# Patient Record
Sex: Female | Born: 1995 | Race: White | Hispanic: Yes | Marital: Married | Smoking: Never smoker
Health system: Southern US, Community
[De-identification: ages and names within clinical notes are randomized; demographics above are authoritative.]

---

## 2017-01-22 DIAGNOSIS — N939 Abnormal uterine and vaginal bleeding, unspecified: Secondary | ICD-10-CM | POA: Insufficient documentation

## 2017-01-22 DIAGNOSIS — R102 Pelvic and perineal pain: Secondary | ICD-10-CM | POA: Insufficient documentation

## 2017-01-22 DIAGNOSIS — K0889 Other specified disorders of teeth and supporting structures: Secondary | ICD-10-CM | POA: Insufficient documentation

## 2017-01-23 ENCOUNTER — Emergency Department (HOSPITAL_COMMUNITY)
Admission: EM | Admit: 2017-01-23 | Discharge: 2017-01-23 | Disposition: A | Discharge status: Home / Self Care | Payer: Self-pay | Attending: Emergency Medicine | Admitting: Emergency Medicine

## 2017-01-23 ENCOUNTER — Emergency Department (HOSPITAL_COMMUNITY): Payer: Self-pay

## 2017-01-23 ENCOUNTER — Encounter (HOSPITAL_COMMUNITY): Payer: Self-pay

## 2017-01-23 DIAGNOSIS — R102 Pelvic and perineal pain: Secondary | ICD-10-CM

## 2017-01-23 LAB — LIPASE, BLOOD: LIPASE: 26 U/L (ref 11–51)

## 2017-01-23 LAB — COMPREHENSIVE METABOLIC PANEL
ALT: 13 U/L — ABNORMAL LOW (ref 14–54)
ANION GAP: 7 (ref 5–15)
AST: 20 U/L (ref 15–41)
Albumin: 4.4 g/dL (ref 3.5–5.0)
Alkaline Phosphatase: 76 U/L (ref 38–126)
BILIRUBIN TOTAL: 0.6 mg/dL (ref 0.3–1.2)
BUN: 5 mg/dL — ABNORMAL LOW (ref 6–20)
CHLORIDE: 107 mmol/L (ref 101–111)
CO2: 25 mmol/L (ref 22–32)
Calcium: 9.1 mg/dL (ref 8.9–10.3)
Creatinine, Ser: 0.6 mg/dL (ref 0.44–1.00)
Glucose, Bld: 87 mg/dL (ref 65–99)
POTASSIUM: 3.8 mmol/L (ref 3.5–5.1)
Sodium: 139 mmol/L (ref 135–145)
TOTAL PROTEIN: 7.8 g/dL (ref 6.5–8.1)

## 2017-01-23 LAB — URINALYSIS, ROUTINE W REFLEX MICROSCOPIC
BACTERIA UA: NONE SEEN
BILIRUBIN URINE: NEGATIVE
GLUCOSE, UA: NEGATIVE mg/dL
Hgb urine dipstick: NEGATIVE
KETONES UR: NEGATIVE mg/dL
NITRITE: NEGATIVE
PH: 6 (ref 5.0–8.0)
PROTEIN: NEGATIVE mg/dL
Specific Gravity, Urine: 1.012 (ref 1.005–1.030)

## 2017-01-23 LAB — CBC
HEMATOCRIT: 40.6 % (ref 36.0–46.0)
Hemoglobin: 13.1 g/dL (ref 12.0–15.0)
MCH: 30.2 pg (ref 26.0–34.0)
MCHC: 32.3 g/dL (ref 30.0–36.0)
MCV: 93.5 fL (ref 78.0–100.0)
Platelets: 271 10*3/uL (ref 150–400)
RBC: 4.34 MIL/uL (ref 3.87–5.11)
RDW: 13.4 % (ref 11.5–15.5)
WBC: 7.5 10*3/uL (ref 4.0–10.5)

## 2017-01-23 LAB — WET PREP, GENITAL
Clue Cells Wet Prep HPF POC: NONE SEEN
SPERM: NONE SEEN
Trich, Wet Prep: NONE SEEN
YEAST WET PREP: NONE SEEN

## 2017-01-23 LAB — I-STAT BETA HCG BLOOD, ED (MC, WL, AP ONLY): I-stat hCG, quantitative: <5 m[IU]/mL (ref <5)

## 2017-01-23 MED ORDER — IBUPROFEN 400 MG PO TABS
400.0000 mg | ORAL_TABLET | Freq: Once | ORAL | Status: AC
  Administered 2017-01-23: 400 mg via ORAL
  Filled 2017-01-23: qty 1

## 2017-01-23 NOTE — ED Triage Notes (Signed)
Pt reports middle abdominal pain. Ongoing 8 years but the flare up started a week ago. She denies n/v/d but states she has Mirena placed. She is also reporting dental pain and infection to left lower molar tooth.

## 2017-01-23 NOTE — Discharge Instructions (Signed)
Your lab work, urinalysis, pelvic ultrasounds were all normal today. There is no evidence of ovarian torsion or cysts. You do not have a urinary tract infection. Your IUD is in the correct place. You do not have trichomonas, bacterial vaginosis or yeast infection.  Your gonorrhea and chlamydia testing are still pending, you will be contact within 2-3 days if results are positive. You declined treatment today, so if your results are positive you will need treatment.   It is still unclear as to what is causing your abdominal/pelvic pain.  Please establish care with an OB/GYN provider and make an appointment for further discussion of your symptoms and complete workup. Return to the emergency department for worsening pelvic pain associated with uncontrollable nausea, vomiting, vaginal bleeding or discharge.  You may take ibuprofen or Tylenol for pain.

## 2017-01-23 NOTE — ED Notes (Signed)
Patient transported to Ultrasound 

## 2017-01-23 NOTE — ED Provider Notes (Signed)
[facility]-EMERGENCY DEPT Provider Note   CSN: [id number] Arrival date & time: 01/22/17  2312     History   Chief Complaint Chief Complaint  Patient presents with  . Abdominal Pain  . Dental Pain    HPI Karen Dawson is a 21 y.o. female presents to the ED with chronic periumbilical abdominal discomfort that radiates to suprapubic abdomen 8 years. Pain has worsened over the last 1 week. Patient states that she has been evaluated for this pain in the past, she was told that she might have endometriosis however she was unable to complete full workup because she became pregnant. No associated nausea, vomiting, urinary symptoms, vaginal discharge. Patient has IUD in place, she has monthly, intermittent vaginal spotting with her IUD which she is currently having. She is sexually active with men only without barrier method of contraception.    HPI   History reviewed. No pertinent past medical history.  There are no active problems to display for this patient.   History reviewed. No pertinent surgical history.  OB History    No data available       Home Medications    Prior to Admission medications   Not on File    Family History No family history on file.  Social History Social History  Substance Use Topics  . Smoking status: Never Smoker  . Smokeless tobacco: Never Used  . Alcohol use No     Allergies   Patient has no known allergies.   Review of Systems Review of Systems  Constitutional: Negative for fever.  Respiratory: Negative for cough and shortness of breath.   Cardiovascular: Negative for chest pain.  Gastrointestinal: Positive for abdominal pain. Negative for constipation, diarrhea, nausea and vomiting.  Genitourinary: Positive for pelvic pain and vaginal bleeding. Negative for difficulty urinating, dysuria, frequency, hematuria and vaginal discharge.  Musculoskeletal: Negative for back pain.  Skin: Negative for wound.     Physical Exam Updated  Vital Signs BP (!) 86/59   Pulse 71   Temp 98.4 F (36.9 C) (Oral)   Resp 16   Wt 54.4 kg (120 lb)   LMP 12/21/2016   SpO2 100%   Physical Exam  Constitutional: She is oriented to person, place, and time. She appears well-developed and well-nourished. No distress.  NAD  HENT:  Head: Normocephalic and atraumatic.  Nose: Nose normal.  Mouth/Throat: Oropharynx is clear and moist. No oropharyngeal exudate.  Eyes: Conjunctivae and EOM are normal. Pupils are equal, round, and reactive to light.  Neck: Normal range of motion. Neck supple.  Cardiovascular: Normal rate, regular rhythm, normal heart sounds and intact distal pulses.   No murmur heard. Pulmonary/Chest: Effort normal and breath sounds normal. She exhibits no tenderness.  Abdominal: Soft. Bowel sounds are normal. She exhibits no distension. There is tenderness.  Suprapubic tenderness No CVA tenderness Negative Mcburney's Negative Murphy's  Genitourinary: Pelvic exam was performed with patient prone. Cervix exhibits motion tenderness. Right adnexum displays tenderness. There is bleeding in the vagina.  Genitourinary Comments: Scant amount of bleeding on cervical os Visualized IUD string through cervical os R adnexal tenderness Positive CMT External genitalia normal without erythema, edema, tenderness, discharge or lesions.  No groin lymphadenopathy.  Vaginal mucosa pink without discharge or lesions.   Musculoskeletal: Normal range of motion. She exhibits no deformity.  Lymphadenopathy:    She has no cervical adenopathy.  Neurological: She is alert and oriented to person, place, and time. No sensory deficit.  Skin: Skin is warm and dry. Capillary  refill takes less than 2 seconds.  Psychiatric: She has a normal mood and affect. Her behavior is normal. Judgment and thought content normal.  Nursing note and vitals reviewed.    ED Treatments / Results  Labs (all labs ordered are listed, but only abnormal results are  displayed) Labs Reviewed  WET PREP, GENITAL - Abnormal; Notable for the following:       Result Value   WBC, Wet Prep HPF POC MANY (*)    All other components within normal limits  COMPREHENSIVE METABOLIC PANEL - Abnormal; Notable for the following:    BUN 5 (*)    ALT 13 (*)    All other components within normal limits  URINALYSIS, ROUTINE W REFLEX MICROSCOPIC - Abnormal; Notable for the following:    Leukocytes, UA TRACE (*)    Squamous Epithelial / LPF 0-5 (*)    All other components within normal limits  LIPASE, BLOOD  CBC  I-STAT BETA HCG BLOOD, ED ([facility], [facility], [facility] ONLY)  GC/CHLAMYDIA PROBE AMP ([place]) NOT AT [facility]    EKG  EKG Interpretation None       Radiology US Transvaginal Non-ob  Result Date: 01/23/2017 CLINICAL DATA:  Acute onset of pelvic pain.  Initial encounter. EXAM: TRANSABDOMINAL AND TRANSVAGINAL ULTRASOUND OF PELVIS DOPPLER ULTRASOUND OF OVARIES TECHNIQUE: Both transabdominal and transvaginal ultrasound examinations of the pelvis were performed. Transabdominal technique was performed for global imaging of the pelvis including uterus, ovaries, adnexal regions, and pelvic cul-de-sac. It was necessary to proceed with endovaginal exam following the transabdominal exam to visualize the uterus and ovaries in greater detail. Color and duplex Doppler ultrasound was utilized to evaluate blood flow to the ovaries. COMPARISON:  None. FINDINGS: Uterus Measurements: 7.3 x 3.1 x 6.0 cm. No fibroids or other mass visualized. Anteverted. Endometrium Thickness: 0.4 cm. An intrauterine device is noted in expected position at the fundus of the uterus. Right ovary Measurements: 2.9 x 2.0 x 1.8 cm. Normal appearance/no adnexal mass. Left ovary Measurements: 3.6 x 1.6 x 1.9 cm. Normal appearance/no adnexal mass. Pulsed Doppler evaluation of both ovaries demonstrates normal low-resistance arterial and venous waveforms. Other findings No abnormal free fluid. IMPRESSION: Unremarkable pelvic  ultrasound. Intrauterine device noted in expected position at the fundus of the uterus. No evidence for ovarian torsion. Electronically Signed   By: Roanna Raider M.D.   On: 01/23/2017 05:56   US Pelvis Complete  Result Date: 01/23/2017 CLINICAL DATA:  Acute onset of pelvic pain.  Initial encounter. EXAM: TRANSABDOMINAL AND TRANSVAGINAL ULTRASOUND OF PELVIS DOPPLER ULTRASOUND OF OVARIES TECHNIQUE: Both transabdominal and transvaginal ultrasound examinations of the pelvis were performed. Transabdominal technique was performed for global imaging of the pelvis including uterus, ovaries, adnexal regions, and pelvic cul-de-sac. It was necessary to proceed with endovaginal exam following the transabdominal exam to visualize the uterus and ovaries in greater detail. Color and duplex Doppler ultrasound was utilized to evaluate blood flow to the ovaries. COMPARISON:  None. FINDINGS: Uterus Measurements: 7.3 x 3.1 x 6.0 cm. No fibroids or other mass visualized. Anteverted. Endometrium Thickness: 0.4 cm. An intrauterine device is noted in expected position at the fundus of the uterus. Right ovary Measurements: 2.9 x 2.0 x 1.8 cm. Normal appearance/no adnexal mass. Left ovary Measurements: 3.6 x 1.6 x 1.9 cm. Normal appearance/no adnexal mass. Pulsed Doppler evaluation of both ovaries demonstrates normal low-resistance arterial and venous waveforms. Other findings No abnormal free fluid. IMPRESSION: Unremarkable pelvic ultrasound. Intrauterine device noted in expected position at the fundus of  the uterus. No evidence for ovarian torsion. Electronically Signed   By: Roanna RaiderJeffery  Chang M.D.   On: 01/23/2017 05:56   [place] Art/ven Flow Abd Pelv Doppler  Result Date: 01/23/2017 CLINICAL DATA:  Acute onset of pelvic pain.  Initial encounter. EXAM: TRANSABDOMINAL AND TRANSVAGINAL ULTRASOUND OF PELVIS DOPPLER ULTRASOUND OF OVARIES TECHNIQUE: Both transabdominal and transvaginal ultrasound examinations of the pelvis were performed.  Transabdominal technique was performed for global imaging of the pelvis including uterus, ovaries, adnexal regions, and pelvic cul-de-sac. It was necessary to proceed with endovaginal exam following the transabdominal exam to visualize the uterus and ovaries in greater detail. Color and duplex Doppler ultrasound was utilized to evaluate blood flow to the ovaries. COMPARISON:  None. FINDINGS: Uterus Measurements: 7.3 x 3.1 x 6.0 cm. No fibroids or other mass visualized. Anteverted. Endometrium Thickness: 0.4 cm. An intrauterine device is noted in expected position at the fundus of the uterus. Right ovary Measurements: 2.9 x 2.0 x 1.8 cm. Normal appearance/no adnexal mass. Left ovary Measurements: 3.6 x 1.6 x 1.9 cm. Normal appearance/no adnexal mass. Pulsed Doppler evaluation of both ovaries demonstrates normal low-resistance arterial and venous waveforms. Other findings No abnormal free fluid. IMPRESSION: Unremarkable pelvic ultrasound. Intrauterine device noted in expected position at the fundus of the uterus. No evidence for ovarian torsion. Electronically Signed   By: Roanna RaiderJeffery  Chang M.D.   On: 01/23/2017 05:56    Procedures Procedures (including critical care time)  Medications Ordered in ED Medications - No data to display   Initial Impression / Assessment and Plan / ED Course  I have reviewed the triage vital signs and the nursing notes.  Pertinent labs & imaging results that were available during my care of the patient were reviewed by me and considered in my medical decision making (see chart for details).     21 year old female presents to the ED with chronic periumbilical abdominal discomfort that radiates to the suprapubic abdomen. Pain has been ongoing for many years, recently worsening over the past week. She has been evaluated for this past and told she may have endometriosis. On exam abdomen is soft, negative McBurney's, negative Murphy's. She has mild suprapubic tenderness, CMT and  right adnexal tenderness on pelvic exam with small amounts of blood in the cervical os. Considering torsion versus ovarian cyst versus ectopic pregnancy. Physical exam and history are not consistent with GI emergent process including appendicitis or diverticulitis.   CBC, CMP, lipase unremarkable today. Urinalysis without evidence of infection. HCG negative. Wet prep without yeast, trichomoniasis or clue cells. Pending GC/chlamydia testing. Pelvic ultrasound completely benign. IUD is in place.   Final Clinical Impressions(s) / ED Diagnoses   Final diagnoses:  Pelvic pain   21 year old female with chronic lower abdominal discomfort for many years, worsening over the last 1 week. ED lab work and imaging reassuring. Doubt emergent GI/GU/GYN process today. Discussed ED lab work and ultrasound results with patient. She declined empiric treatment of STDs in the ED. Patient is considered safe for discharge with outpatient GYN follow-up. She verbalized understanding and is agreeable with plan.  New Prescriptions New Prescriptions   No medications on file     Jerrell MylarGibbons, Glorene Leitzke J, PA-C 01/23/17 [id number]    Geoffery Lyonselo, [place], MD 01/23/17 [phone]

## 2017-01-23 NOTE — ED Notes (Signed)
Pt states she understands instructions. Home stable with family with steady gait.  

## 2017-01-24 LAB — GC/CHLAMYDIA PROBE AMP (~~LOC~~) NOT AT ARMC
CHLAMYDIA, DNA PROBE: NEGATIVE
Neisseria Gonorrhea: NEGATIVE

## 2017-03-04 ENCOUNTER — Encounter: Payer: Self-pay | Admitting: Obstetrics & Gynecology

## 2017-05-29 ENCOUNTER — Encounter: Payer: Self-pay | Admitting: Obstetrics and Gynecology

## 2017-05-30 ENCOUNTER — Encounter: Payer: Self-pay | Admitting: Obstetrics and Gynecology

## 2017-05-30 NOTE — Progress Notes (Signed)
Patient did not keep GYN referral appointment for 05/29/2017.  Cornelia Copaharlie Taniyah Ballow, Jr MD Attending [facility](Faculty Practice)

## 2018-09-13 IMAGING — US US PELVIS COMPLETE
1 series · 13 of 25 positions shown · non-contrast
Comparison: None.

CLINICAL DATA: Acute onset of pelvic pain.  Initial encounter.

EXAM:
TRANSABDOMINAL AND TRANSVAGINAL ULTRASOUND OF PELVIS
DOPPLER ULTRASOUND OF OVARIES
TECHNIQUE: Both transabdominal and transvaginal ultrasound examinations of the
pelvis were performed. Transabdominal technique was performed for
global imaging of the pelvis including uterus, ovaries, adnexal
regions, and pelvic cul-de-sac.
It was necessary to proceed with endovaginal exam following the
transabdominal exam to visualize the uterus and ovaries in greater
detail. Color and duplex Doppler ultrasound was utilized to evaluate
blood flow to the ovaries.

[Series 1: us pelvis complete · 0.18mm/px · 13 of 84 slices shown]
[im 1/84]
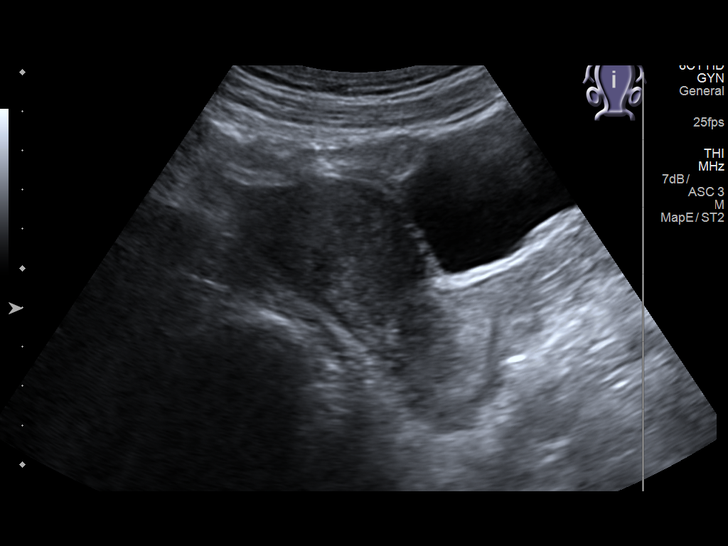
[im 7/84]
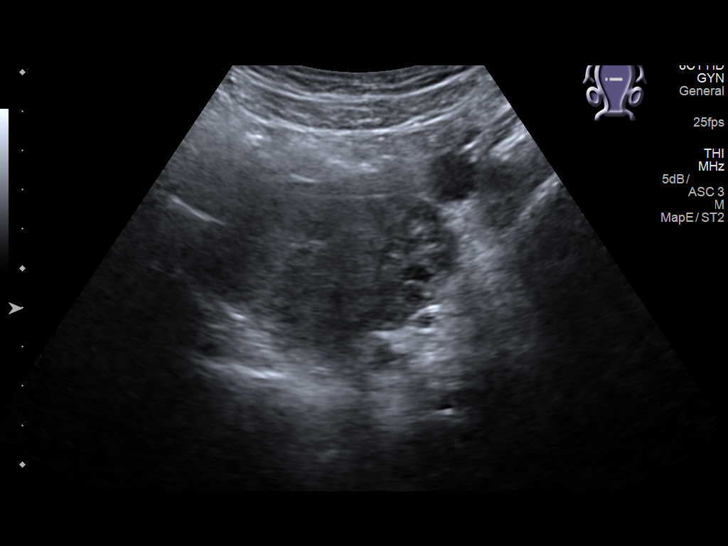
[im 14/84]
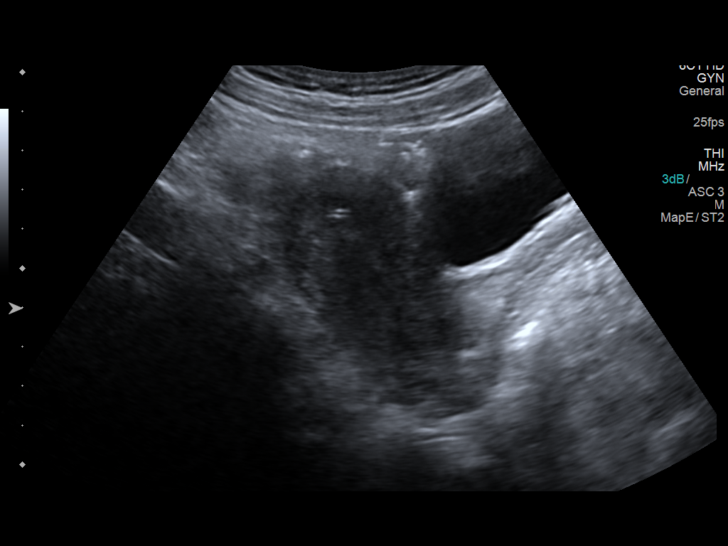
[im 21/84]
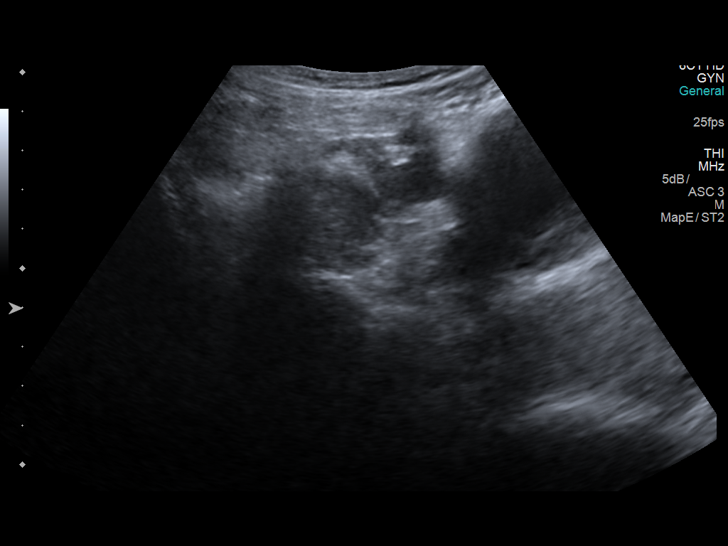
[im 28/84]
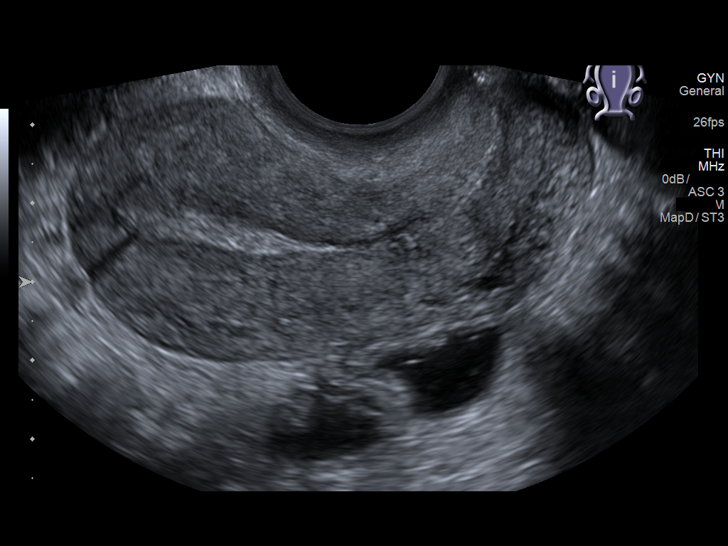
[im 35/84]
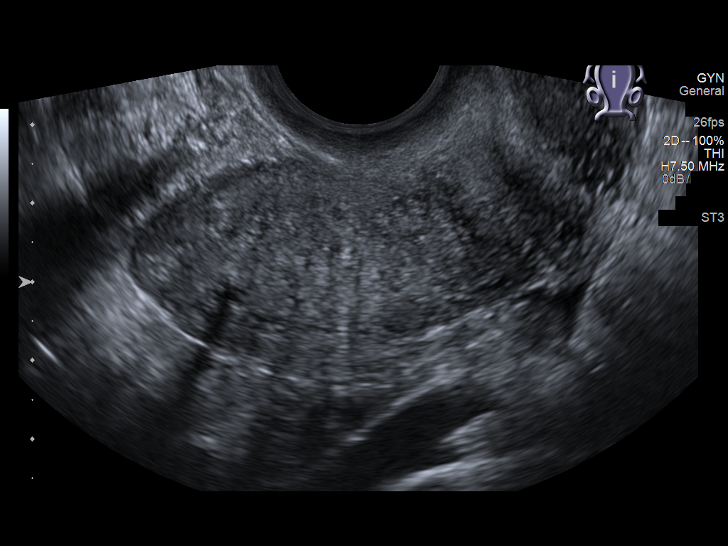
[im 42/84]
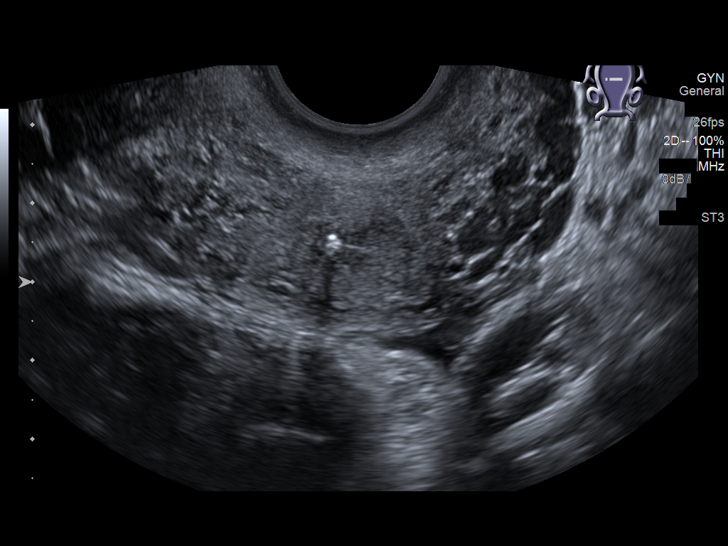
[im 49/84]
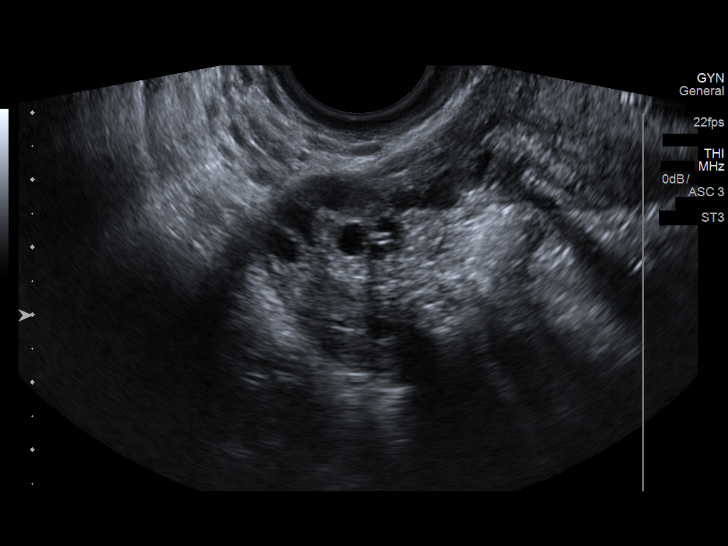
[im 56/84]
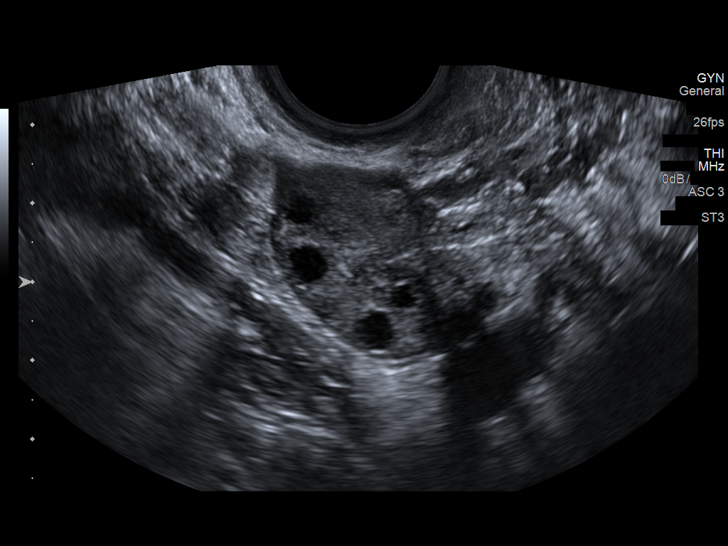
[im 63/84]
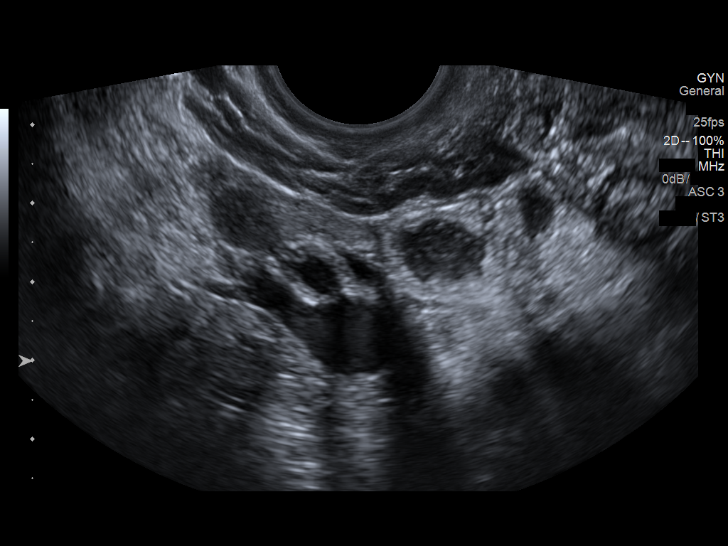
[im 70/84]
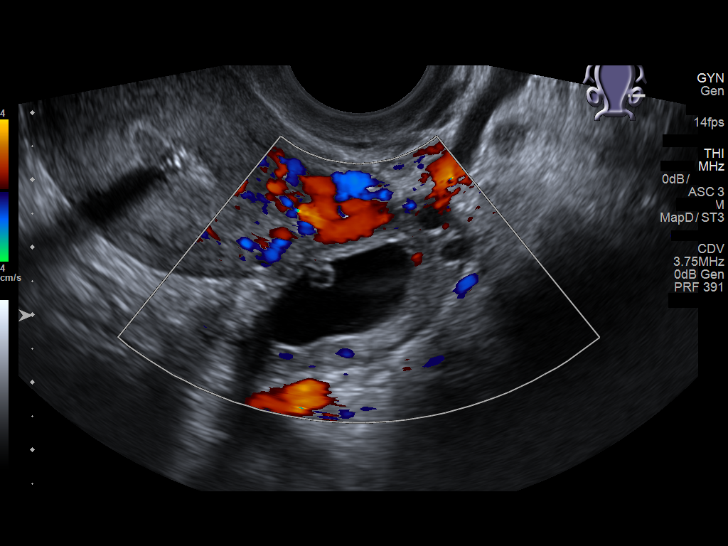
[im 77/84]
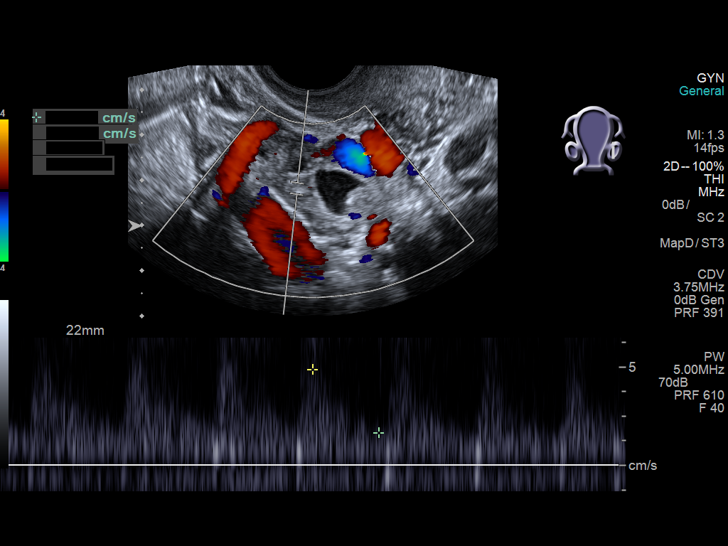
[im 84/84]
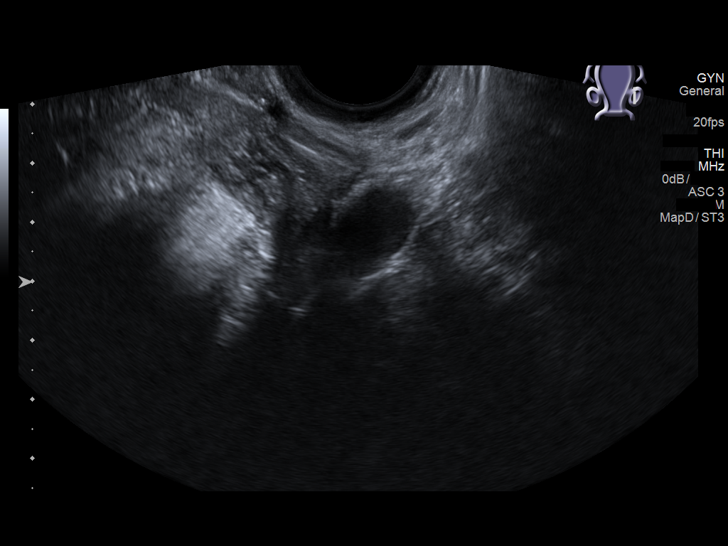

[13 of 25 positions shown; findings below may reference images not displayed]

FINDINGS: Uterus

Measurements: 7.3 x 3.1 x 6.0 cm. No fibroids or other mass
visualized. Anteverted.

Endometrium

Thickness: 0.4 cm. An intrauterine device is noted in expected
position at the fundus of the uterus.

Right ovary

Measurements: 2.9 x 2.0 x 1.8 cm. Normal appearance/no adnexal mass.

Left ovary

Measurements: 3.6 x 1.6 x 1.9 cm. Normal appearance/no adnexal mass.

Pulsed Doppler evaluation of both ovaries demonstrates normal
low-resistance arterial and venous waveforms.

Other findings

No abnormal free fluid.
IMPRESSION: Unremarkable pelvic ultrasound. Intrauterine device noted in
expected position at the fundus of the uterus. No evidence for
ovarian torsion.
